# Patient Record
Sex: Male | Born: 2000 | Race: White | Hispanic: No | Marital: Single | Smoking: Never smoker
Health system: Southern US, Community
[De-identification: ages and names within clinical notes are randomized; demographics above are authoritative.]

## PROBLEM LIST (undated history)

## (undated) DIAGNOSIS — C801 Malignant (primary) neoplasm, unspecified: Secondary | ICD-10-CM

## (undated) HISTORY — PX: OTHER SURGICAL HISTORY: SHX169

## (undated) HISTORY — PX: HERNIA REPAIR: SHX51

---
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
## 2018-01-27 DIAGNOSIS — R002 Palpitations: Secondary | ICD-10-CM | POA: Diagnosis not present

## 2018-01-27 DIAGNOSIS — R42 Dizziness and giddiness: Secondary | ICD-10-CM | POA: Diagnosis not present

## 2018-01-27 DIAGNOSIS — R0602 Shortness of breath: Secondary | ICD-10-CM | POA: Diagnosis not present

## 2018-02-01 ENCOUNTER — Ambulatory Visit: Payer: Self-pay | Admitting: *Deleted

## 2018-02-01 ENCOUNTER — Telehealth: Payer: Self-pay

## 2018-02-01 DIAGNOSIS — M67911 Unspecified disorder of synovium and tendon, right shoulder: Secondary | ICD-10-CM | POA: Diagnosis not present

## 2018-02-01 NOTE — Telephone Encounter (Signed)
Please advise 

## 2018-02-01 NOTE — Telephone Encounter (Signed)
ok 

## 2018-02-01 NOTE — Telephone Encounter (Signed)
Pt establishing w/ Dr. Nani Ravens.

## 2018-02-01 NOTE — Telephone Encounter (Signed)
Phone call to pt. Reported he had onset of a panic attack one week ago that lasted several hours, following being "intoxicated on marijuana."  Stated at time of the panic attack, "I felt very anxious, like I was going to die, my heart was racing, and I felt  Dizzy."  Reported the episode was the 1st time he has felt like that.  Reported he has had one other episode, since then, that he felt heart palpitations.  Went to the ER, and was evaluated; was told it was likely related to a panic attack.  Reported since the initial panic attack one week ago, he has had this feeling that he is "disconnected from reality, brain fog, and an overall anxious feeling."   Stated today, he is feeling better.  Has decreased his caffeine consumption in the past week.  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  Requesting an appt. to establish care.  Appt. scheduled for acute care visit on 7/19, and estab. Care appt. On 7/31.  Care advice given per protocol.  Verb. Understanding.  Agrees with plan.        Reason for Disposition . [1] Symptoms of anxiety or panic AND [2] has not been evaluated for this by physician  Answer Assessment - Initial Assessment Questions 1. CONCERN: "What happened that made you call today?"     Had a panic attack about one week ago after he was intoxicated with marijuana 2. ANXIETY SYMPTOM SCREENING: "Can you describe how you have been feeling?"  (e.g., tense, restless, panicky, anxious, keyed up, trouble sleeping, trouble concentrating)     Intermittent feelings of being disconnected from reality, brain fog and anxiety 3. ONSET: "How long have you been feeling this way?"     About one week  4. RECURRENT: "Have you felt this way before?"  If yes: "What happened that time?" "What helped these feelings go away in the past?"      This just started after a panic attack during being high on Marijuana 5. RISK OF HARM - SUICIDAL IDEATION:  "Do you ever have thoughts  of hurting or killing yourself?"  (e.g., yes, no, no but preoccupation with thoughts about death)   - INTENT:  "Do you have thoughts of hurting or killing yourself right NOW?" (e.g., yes, no, N/A)   - PLAN: "Do you have a specific plan for how you would do this?" (e.g., gun, knife, overdose, no plan, N/A)     Denied any intent to harm self 6. RISK OF HARM - HOMICIDAL IDEATION:  "Do you ever have thoughts of hurting or killing someone else?"  (e.g., yes, no, no but preoccupation with thoughts about death)   - INTENT:  "Do you have thoughts of hurting or killing someone right NOW?" (e.g., yes, no, N/A)   - PLAN: "Do you have a specific plan for how you would do this?" (e.g., gun, knife, no plan, N/A)      Denied any intent to hurt others 7. FUNCTIONAL IMPAIRMENT: "How have things been going for you overall in your life? Have you had any more difficulties than usual doing your normal daily activities?"  (e.g., better, same, worse; self-care, school, work, interactions)     Working and functioning 8. SUPPORT: "Who is with you now?" "Who do you live with?" "Do you have family or friends nearby who you can talk to?"      Strong support system through family 65. THERAPIST: "Do you have a  counselor or therapist? Name?"     BOFBPZ  02. STRESSORS: "Has there been any new stress or recent changes in your life?"       Denies  11. CAFFEINE ABUSE: "Do you drink caffeinated beverages, and how much each day?" (e.g., coffee, tea, colas)      Has been a heavy caffeine user in past; cut down a lot over past week 12. SUBSTANCE ABUSE: "Do you use any illegal drugs or alcohol?"       Has smoked Marijuana ; uses it infreq.  13. OTHER SYMPTOMS: "Do you have any other physical symptoms right now?" (e.g., chest pain, palpitations, difficulty breathing, fever)       Had episode of heart palpitations x 2; recent had heart eval. ; denied any of these symptoms at present time  Protocols used: ANXIETY AND PANIC  ATTACK-A-AH

## 2018-02-01 NOTE — Telephone Encounter (Signed)
Copied from [place] [id number]. Topic: General - Other >> Feb 01, 2018  8:39 AM Yvette Rack wrote: Reason for CRM: Dr Larose Kells pt Sean Pena DOB [birth year] would like for Dr Larose Kells to be his sons provider you can reach [name] at [phone]

## 2018-02-03 ENCOUNTER — Ambulatory Visit: Payer: 59 | Admitting: Family Medicine

## 2018-02-07 DIAGNOSIS — M25511 Pain in right shoulder: Secondary | ICD-10-CM | POA: Diagnosis not present

## 2018-02-15 ENCOUNTER — Ambulatory Visit (INDEPENDENT_AMBULATORY_CARE_PROVIDER_SITE_OTHER): Payer: 59 | Admitting: Family Medicine

## 2018-02-15 ENCOUNTER — Encounter: Payer: Self-pay | Admitting: Family Medicine

## 2018-02-15 VITALS — BP 112/72 | HR 73 | Temp 97.5°F | Ht 73.0 in | Wt 172.4 lb

## 2018-02-15 DIAGNOSIS — M67911 Unspecified disorder of synovium and tendon, right shoulder: Secondary | ICD-10-CM | POA: Diagnosis not present

## 2018-02-15 DIAGNOSIS — Z7689 Persons encountering health services in other specified circumstances: Secondary | ICD-10-CM | POA: Diagnosis not present

## 2018-02-15 DIAGNOSIS — F1299 Cannabis use, unspecified with unspecified cannabis-induced disorder: Secondary | ICD-10-CM | POA: Diagnosis not present

## 2018-02-15 DIAGNOSIS — Z23 Encounter for immunization: Secondary | ICD-10-CM | POA: Diagnosis not present

## 2018-02-15 NOTE — Progress Notes (Signed)
Pre visit review using our clinic review tool, if applicable. No additional management support is needed unless otherwise documented below in the visit note. 

## 2018-02-15 NOTE — Patient Instructions (Addendum)
Please consider counseling. Contact [phone] to schedule an appointment or inquire about cost/insurance coverage.  If things do not continue to or stay improved, let me know if you're interested in trying a medication aimed at controlling symptoms.   Good luck this school year.   Let us know if you need anything.

## 2018-02-15 NOTE — Addendum Note (Signed)
Addended by: Sharon Seller B on: 02/15/2018 07:35 AM   Modules accepted: Orders

## 2018-02-15 NOTE — Progress Notes (Signed)
Chief Complaint  Patient presents with  . New Patient (Initial Visit)       New Patient Visit SUBJECTIVE: HPI: Sean Pena is an 17 y.o.male who is being seen for establishing care.  The patient was previously seen at ped's office.  Pt smoked cannabis around 3 weeks ago. Had been having issues with DPP. Got better over past 3 d. Knows it is temporary, curious if any other recs. Does not have personal or famhx of mental health disorders.   Going to college. Has only received Men a,c,w,y. Needs MenB.  No Known Allergies  Past Medical History:  Diagnosis Date  . Cancer [facility])    Chondrosarcoma- 2 surgeries 2016   Past Surgical History:  Procedure Laterality Date  . HERNIA REPAIR    . OTHER SURGICAL HISTORY     Mass removal from femur; L   Family History  Problem Relation Age of Onset  . Heart attack Neg Hx   . Cancer Neg Hx   . Mental illness Neg Hx      No Known Allergies   Takes no meds routinely.  ROS Psych: As noted in HPI   OBJECTIVE: BP 112/72 (BP Location: Left Arm, Patient Position: Sitting, Cuff Size: Normal)   Pulse 73   Temp (!) 97.5 F (36.4 C) (Oral)   Ht 6\' 1"  (1.854 m)   Wt 172 lb 6 oz (78.2 kg)   SpO2 97%   BMI 22.74 kg/m   Constitutional: -  VS reviewed -  Well developed, well nourished, appears stated age -  No apparent distress  Psychiatric: -  Oriented to person, place, and time -  Memory intact -  Affect and mood normal -  Fluent conversation, good eye contact -  Judgment and insight age appropriate  Eye: -  Conjunctivae clear, no discharge -  Pupils symmetric, round, reactive to light  ENMT: -  MMM    Pharynx moist, no exudate, no erythema  Cardiovascular: -  RRR -  No LE edema  Respiratory: -  Normal respiratory effort, no accessory muscle use, no retraction -  Breath sounds equal, no wheezes, no ronchi, no crackles  Neurological:  -  DTR's equal and symmetric  Skin: -  No significant lesion on inspection -  Warm and dry  to palpation   ASSESSMENT/PLAN: Establishing care with new doctor, encounter for  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 Tread lightly w cannabis or other psychoactive substances in future while at school. [facility] info given. Send message if not improving, can start medication if interested.   Patient should return in 1 yr for CPE when he is 27. The patient voiced understanding and agreement to the plan.   [place], DO 02/15/18  7:28 AM

## 2018-02-17 ENCOUNTER — Encounter: Payer: Self-pay | Admitting: Family Medicine

## 2018-02-21 DIAGNOSIS — M7541 Impingement syndrome of right shoulder: Secondary | ICD-10-CM | POA: Diagnosis not present

## 2018-02-28 DIAGNOSIS — Q233 Congenital mitral insufficiency: Secondary | ICD-10-CM | POA: Diagnosis not present

## 2018-02-28 DIAGNOSIS — Q231 Congenital insufficiency of aortic valve: Secondary | ICD-10-CM | POA: Diagnosis not present

## 2018-03-03 DIAGNOSIS — M7541 Impingement syndrome of right shoulder: Secondary | ICD-10-CM | POA: Diagnosis not present

## 2018-03-23 DIAGNOSIS — Z7289 Other problems related to lifestyle: Secondary | ICD-10-CM | POA: Diagnosis not present

## 2018-03-23 DIAGNOSIS — M7541 Impingement syndrome of right shoulder: Secondary | ICD-10-CM | POA: Diagnosis not present

## 2018-03-23 DIAGNOSIS — R1011 Right upper quadrant pain: Secondary | ICD-10-CM | POA: Diagnosis not present

## 2018-03-23 DIAGNOSIS — K2970 Gastritis, unspecified, without bleeding: Secondary | ICD-10-CM | POA: Diagnosis not present

## 2018-04-21 DIAGNOSIS — H66004 Acute suppurative otitis media without spontaneous rupture of ear drum, recurrent, right ear: Secondary | ICD-10-CM | POA: Diagnosis not present

## 2018-05-02 ENCOUNTER — Encounter: Payer: Self-pay | Admitting: Family Medicine

## 2018-05-04 ENCOUNTER — Ambulatory Visit (INDEPENDENT_AMBULATORY_CARE_PROVIDER_SITE_OTHER): Payer: 59 | Admitting: Family Medicine

## 2018-05-04 ENCOUNTER — Encounter: Payer: Self-pay | Admitting: Family Medicine

## 2018-05-04 VITALS — BP 102/68 | HR 93 | Temp 98.6°F | Ht 72.0 in | Wt 179.5 lb

## 2018-05-04 DIAGNOSIS — Z23 Encounter for immunization: Secondary | ICD-10-CM

## 2018-05-04 DIAGNOSIS — R748 Abnormal levels of other serum enzymes: Secondary | ICD-10-CM | POA: Diagnosis not present

## 2018-05-04 LAB — IBC PANEL
IRON: 163 ug/dL (ref 42–165)
Saturation Ratios: 42.8 % (ref 20.0–50.0)
TRANSFERRIN: 272 mg/dL (ref 212.0–360.0)

## 2018-05-04 LAB — HEPATITIS B SURFACE ANTIGEN: Hepatitis B Surface Ag: NONREACTIVE

## 2018-05-04 LAB — HEPATIC FUNCTION PANEL
ALT: 17 U/L (ref 0–53)
AST: 21 U/L (ref 0–37)
Albumin: 4.9 g/dL (ref 3.5–5.2)
Alkaline Phosphatase: 63 U/L (ref 52–171)
BILIRUBIN DIRECT: 0.2 mg/dL (ref 0.0–0.3)
BILIRUBIN TOTAL: 1 mg/dL (ref 0.2–1.2)
TOTAL PROTEIN: 7.4 g/dL (ref 6.0–8.3)

## 2018-05-04 LAB — HEPATITIS C ANTIBODY
Hepatitis C Ab: NONREACTIVE
SIGNAL TO CUT-OFF: 0.02 (ref <1.00)

## 2018-05-04 LAB — FERRITIN: Ferritin: 77.2 ng/mL (ref 22.0–322.0)

## 2018-05-04 NOTE — Patient Instructions (Signed)
We will be in contact regarding your lab results.   If still elevated, I would like to recheck an ultrasound of your liver. Depending on those results, I may ask you abstain from alcohol until we recheck.  Drink responsibly.   Let us know if you need anything.

## 2018-05-04 NOTE — Progress Notes (Signed)
Pre visit review using our clinic review tool, if applicable. No additional management support is needed unless otherwise documented below in the visit note. 

## 2018-05-04 NOTE — Progress Notes (Signed)
Chief Complaint  Patient presents with  . Results    Subjective: Patient is a 17 y.o. male here for liver concern.  Drinks 2-3 times per week. 8-9 drinks during binges, 3-4 drinks otherwise. Started 2 years ago.  He was in the hospital in February of this year and had elevated liver enzymes.  An ultrasound showed findings consistent with fatty liver.  No gallstones or gallbladder inflammation.  On 2 occasions since then, he has had RUQ abd pain described as burning after drinking copious amounts of alcohol, usually also when liquor is involved. This has not happened recently.  He denies any family history of liver pathologies.  He is not having any yellowing of the skin, bruising, prominent vessels of the abdomen, clay colored stools, or bloating.  ROS: Skin: no jaundice GI: As noted in HPI  Past Medical History:  Diagnosis Date  . Cancer [facility])    Chondrosarcoma of L humerus- 2 surgeries 2016    Objective: BP 102/68 (BP Location: Left Arm, Patient Position: Sitting, Cuff Size: Normal)   Pulse 93   Temp 98.6 F (37 C) (Oral)   Ht 6' (1.829 m)   Wt 179 lb 8 oz (81.4 kg)   SpO2 98%   BMI 24.34 kg/m  General: Awake, appears stated age HEENT: MMM, EOMi, no scleral icterus Abd: Soft, NT, ND, no masses or organomegaly, neg Murphy's Skin: No jaundice, no caput medusa or telangiectasias Heart: RRR, no murmurs Lungs: CTAB, no rales, wheezes or rhonchi. No accessory muscle use Psych: Age appropriate judgment and insight, normal affect and mood  Assessment and Plan: Elevated liver enzymes - Plan: Ferritin, Hepatitis C antibody, Hepatitis B surface antigen, IBC panel, Hepatic function panel  Flu vaccine need - Plan: Flu Vaccine QUAD 6+ mos PF IM (Fluarix Quad PF)  Orders as above. If still elevated, will ck [place] again. May need to stop alcohol consumption depending on his results.  Fu pending above. The patient voiced understanding and agreement to the plan.  [place],  DO 05/04/18  12:04 PM

## 2018-05-09 DIAGNOSIS — H9209 Otalgia, unspecified ear: Secondary | ICD-10-CM | POA: Diagnosis not present

## 2018-05-11 DIAGNOSIS — H6120 Impacted cerumen, unspecified ear: Secondary | ICD-10-CM | POA: Diagnosis not present

## 2018-05-25 DIAGNOSIS — H1089 Other conjunctivitis: Secondary | ICD-10-CM | POA: Diagnosis not present

## 2018-05-31 DIAGNOSIS — C499 Malignant neoplasm of connective and soft tissue, unspecified: Secondary | ICD-10-CM | POA: Diagnosis not present

## 2018-05-31 DIAGNOSIS — C419 Malignant neoplasm of bone and articular cartilage, unspecified: Secondary | ICD-10-CM | POA: Diagnosis not present

## 2018-06-07 DIAGNOSIS — Q231 Congenital insufficiency of aortic valve: Secondary | ICD-10-CM | POA: Diagnosis not present

## 2018-06-07 DIAGNOSIS — R002 Palpitations: Secondary | ICD-10-CM | POA: Diagnosis not present

## 2018-06-07 DIAGNOSIS — Q233 Congenital mitral insufficiency: Secondary | ICD-10-CM | POA: Diagnosis not present

## 2018-06-21 DIAGNOSIS — R002 Palpitations: Secondary | ICD-10-CM | POA: Diagnosis not present

## 2018-08-21 DIAGNOSIS — R29898 Other symptoms and signs involving the musculoskeletal system: Secondary | ICD-10-CM | POA: Diagnosis not present

## 2018-09-04 DIAGNOSIS — R29898 Other symptoms and signs involving the musculoskeletal system: Secondary | ICD-10-CM | POA: Diagnosis not present

## 2018-09-11 DIAGNOSIS — R1011 Right upper quadrant pain: Secondary | ICD-10-CM | POA: Diagnosis not present

## 2018-09-18 DIAGNOSIS — R29898 Other symptoms and signs involving the musculoskeletal system: Secondary | ICD-10-CM | POA: Diagnosis not present

## 2019-03-05 ENCOUNTER — Telehealth: Payer: Self-pay | Admitting: *Deleted

## 2019-03-05 NOTE — Telephone Encounter (Signed)
Called to offer Virtual Visit but the patient stated he did not feel he needed one for now.

## 2019-03-05 NOTE — Telephone Encounter (Signed)
Unknown if he should be concerned. Cont to social distance, wear masks and look out for symptoms. Ty.

## 2019-03-05 NOTE — Telephone Encounter (Signed)
Who Is Calling Patient / Member / Family / Caregiver Call Type Triage / Clinical Relationship To Patient Self Return Phone Number [phone] (Primary) Chief Complaint Health information question (non symptomatic) Reason for Call Symptomatic / Request for Health Information Initial Comment Caller states that he has cv19 and he has questions about the disease. Translation No Nurse Assessment Nurse: Markus Daft, RN, [place] Date/Time (Eastern Time): 03/03/2019 11:54:21 AM Confirm and document reason for call. If symptomatic, describe symptoms. ---Caller states that he has COVID-19, tested positive on July 20. c/o fatigue for a few days. And then felt good for 2-3 weeks. He then had fatigue again on Tuesday or Wednesday and for the last few days. He got tested again yesterday, and was positive again. He stayed in quarantine for 10 days, til 30th, and then he had felt better, and so he returned to life as usual. He had a new exposure this week to people who tested positive. Should he be concerned?

## 2019-03-05 NOTE — Telephone Encounter (Signed)
Patient aware.

## 2019-03-07 ENCOUNTER — Encounter: Payer: Self-pay | Admitting: Family Medicine

## 2019-03-22 ENCOUNTER — Encounter: Payer: Self-pay | Admitting: Family Medicine

## 2019-06-02 IMAGING — DX DG CHEST 2V
2 series · 2 of 2 positions shown · non-contrast
Comparison: 08/21/2006

CLINICAL DATA: Cough

EXAM:
CHEST  2 VIEW

[chest pa]
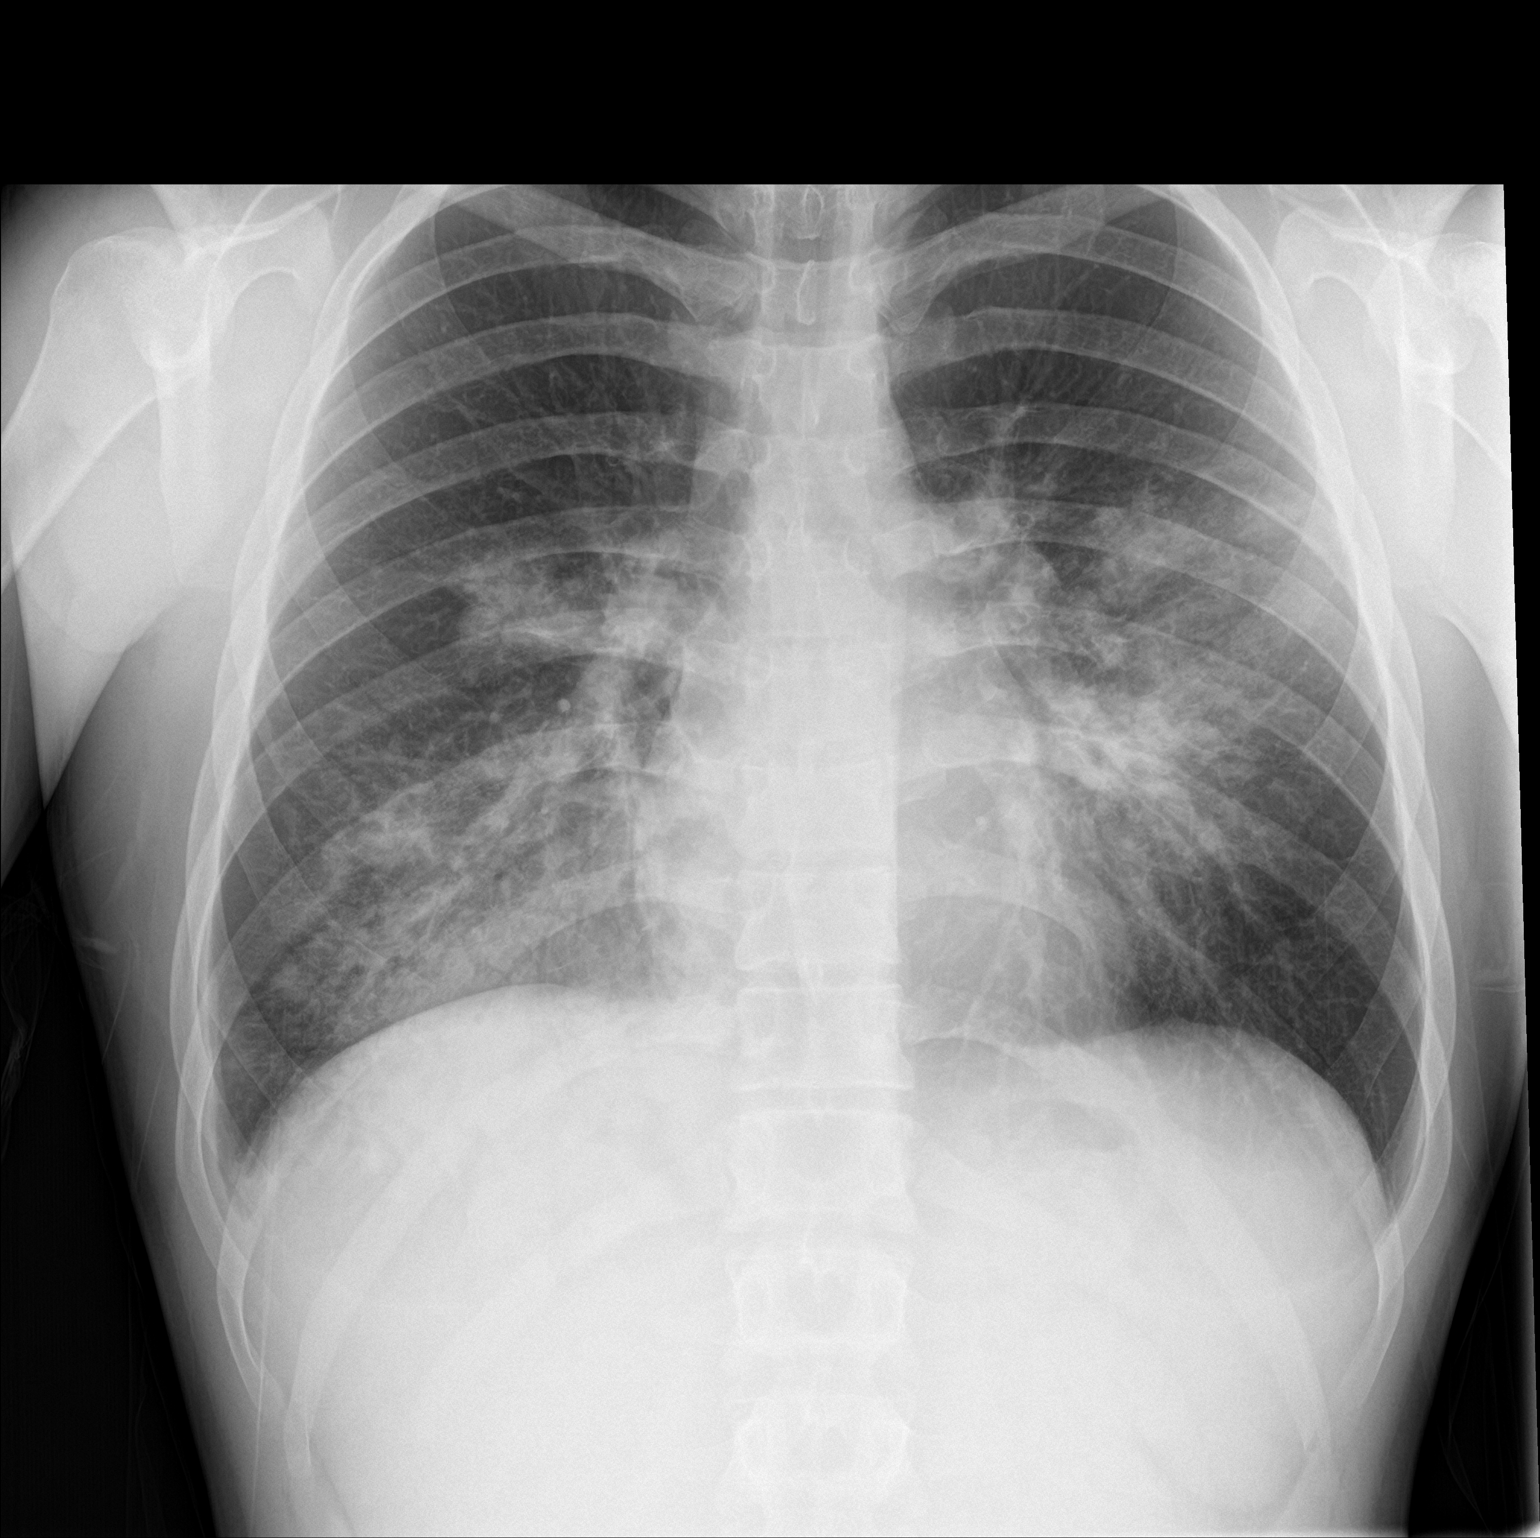

[chest lat]
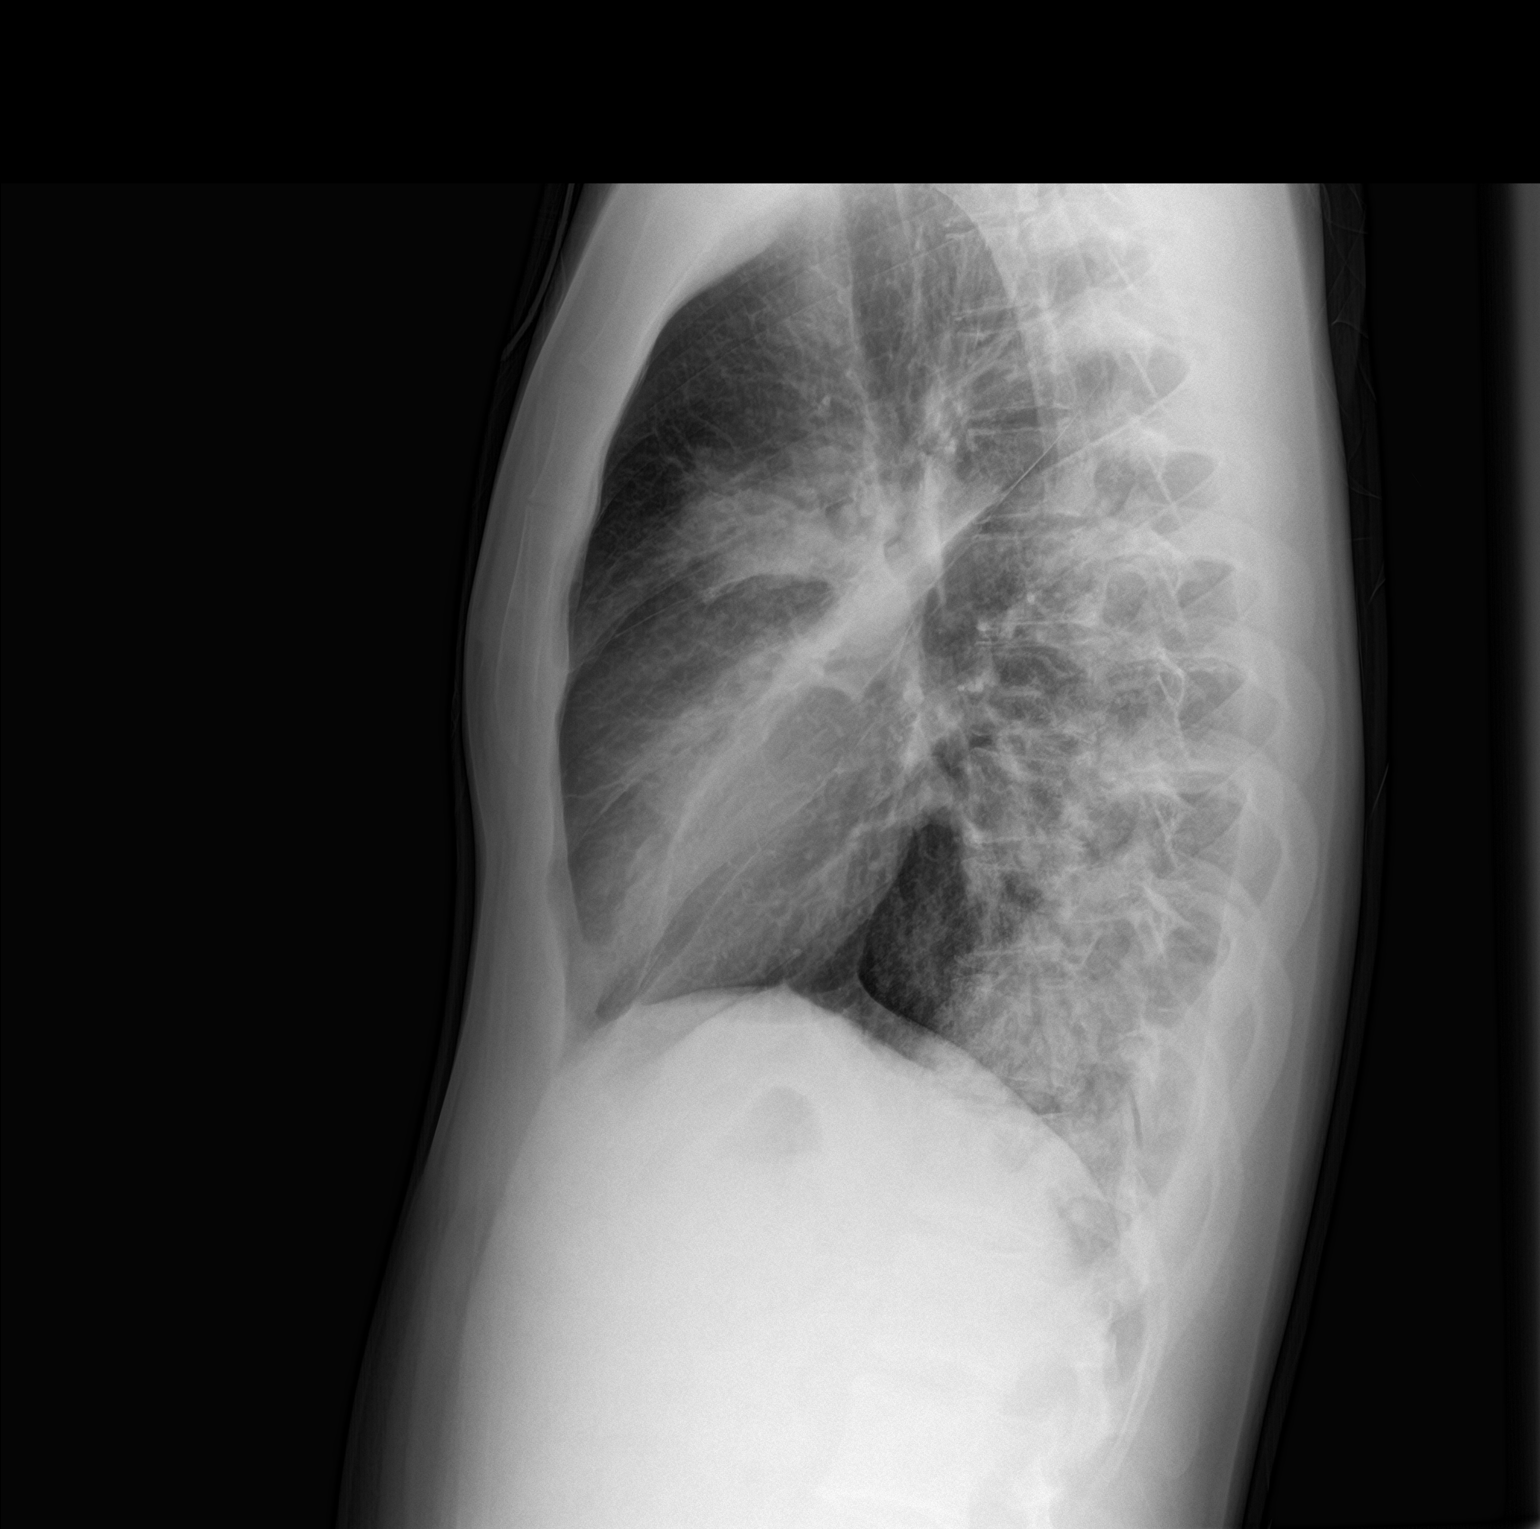

[2 of 2 positions shown; findings below may reference images not displayed]

FINDINGS: Areas of pneumonia bilaterally in the perihilar regions and in the
right lower lobe. No effusions. Heart is normal size. No acute bony
abnormality.
IMPRESSION: Bilateral upper lobe and right lower lobe pneumonia.

## 2019-06-04 IMAGING — DX DG CHEST 2V
2 series · 2 of 2 positions shown · non-contrast
Comparison: Chest x-ray of August 27, 2017

CLINICAL DATA: Dyspnea, known pneumonia.

EXAM:
CHEST  2 VIEW

[chest pa]
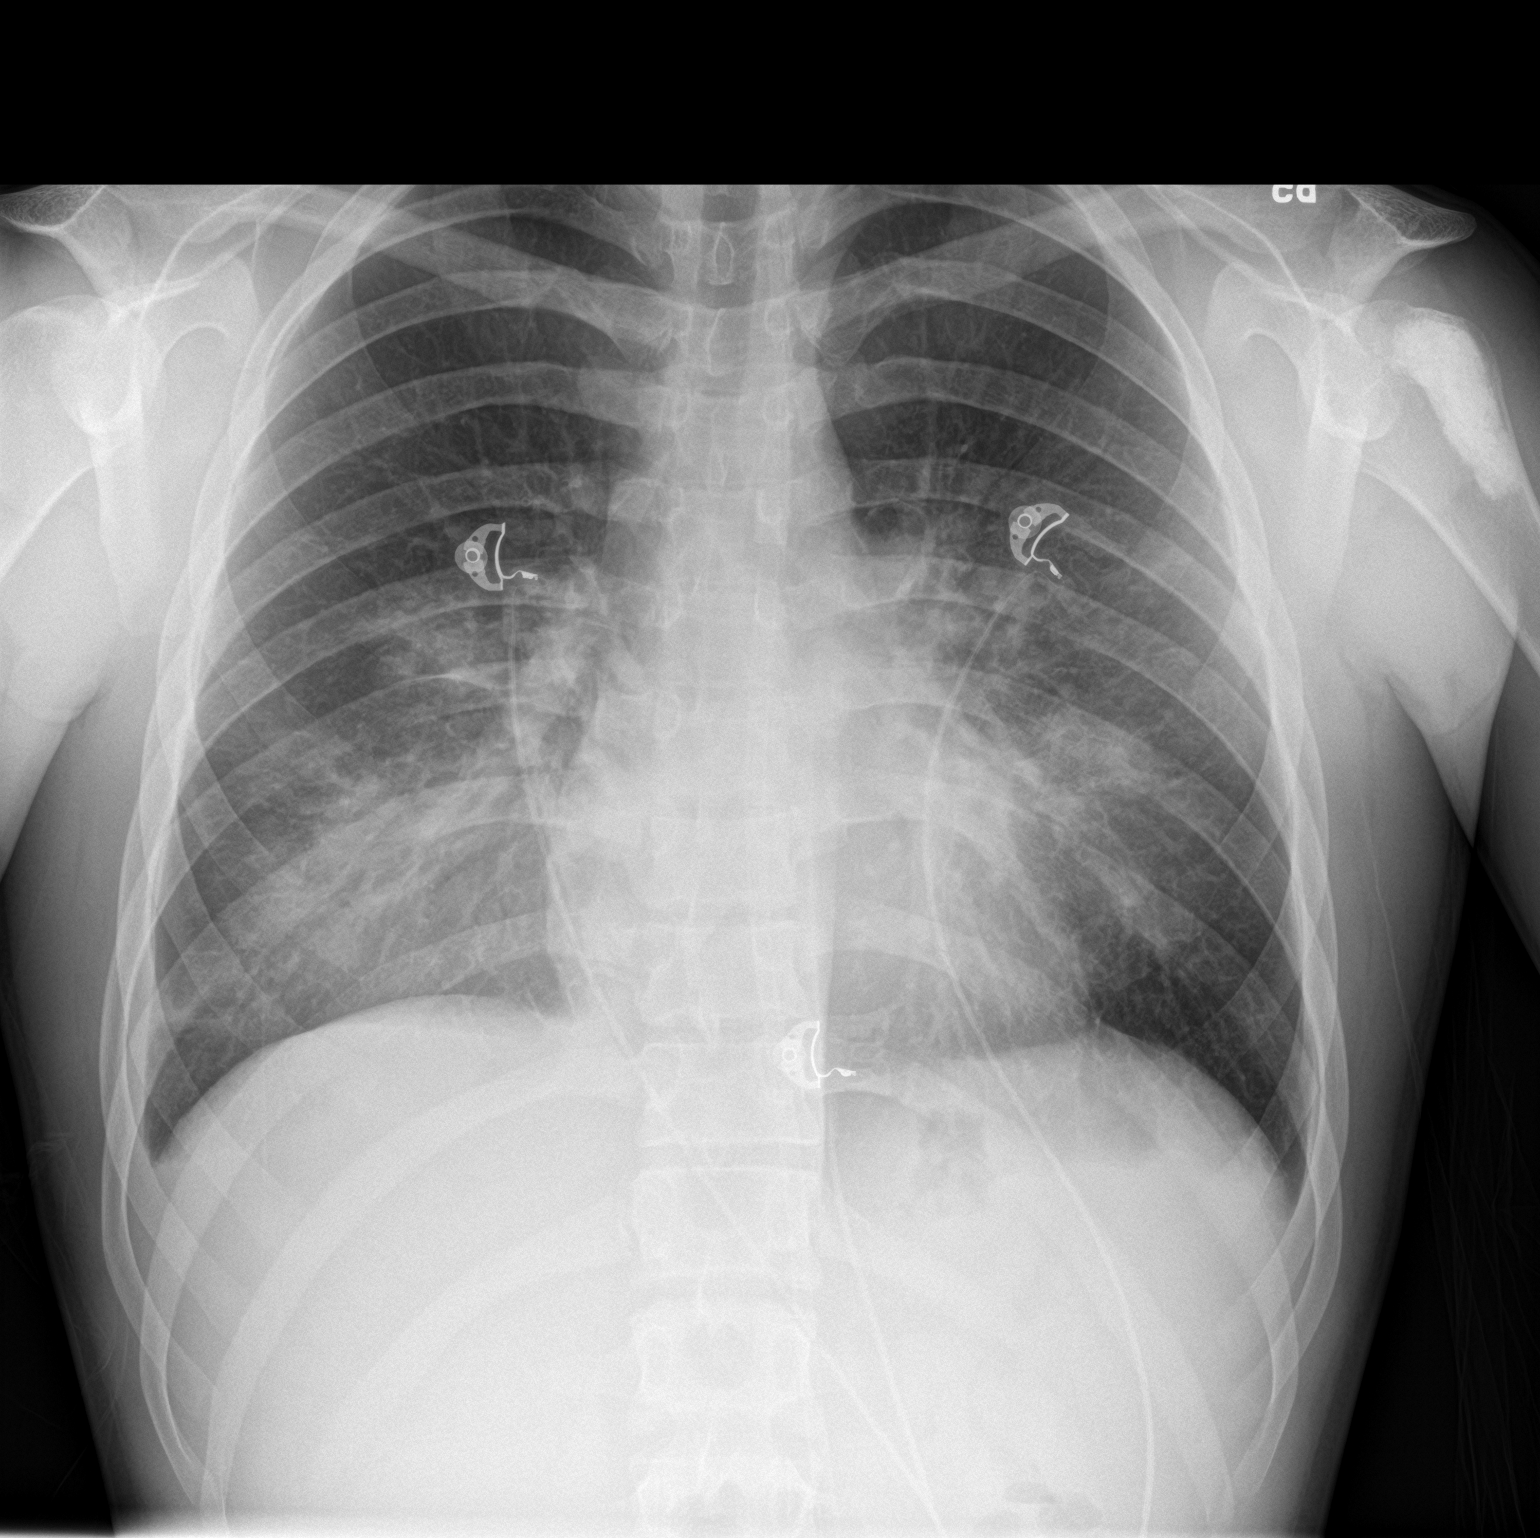

[chest lat]
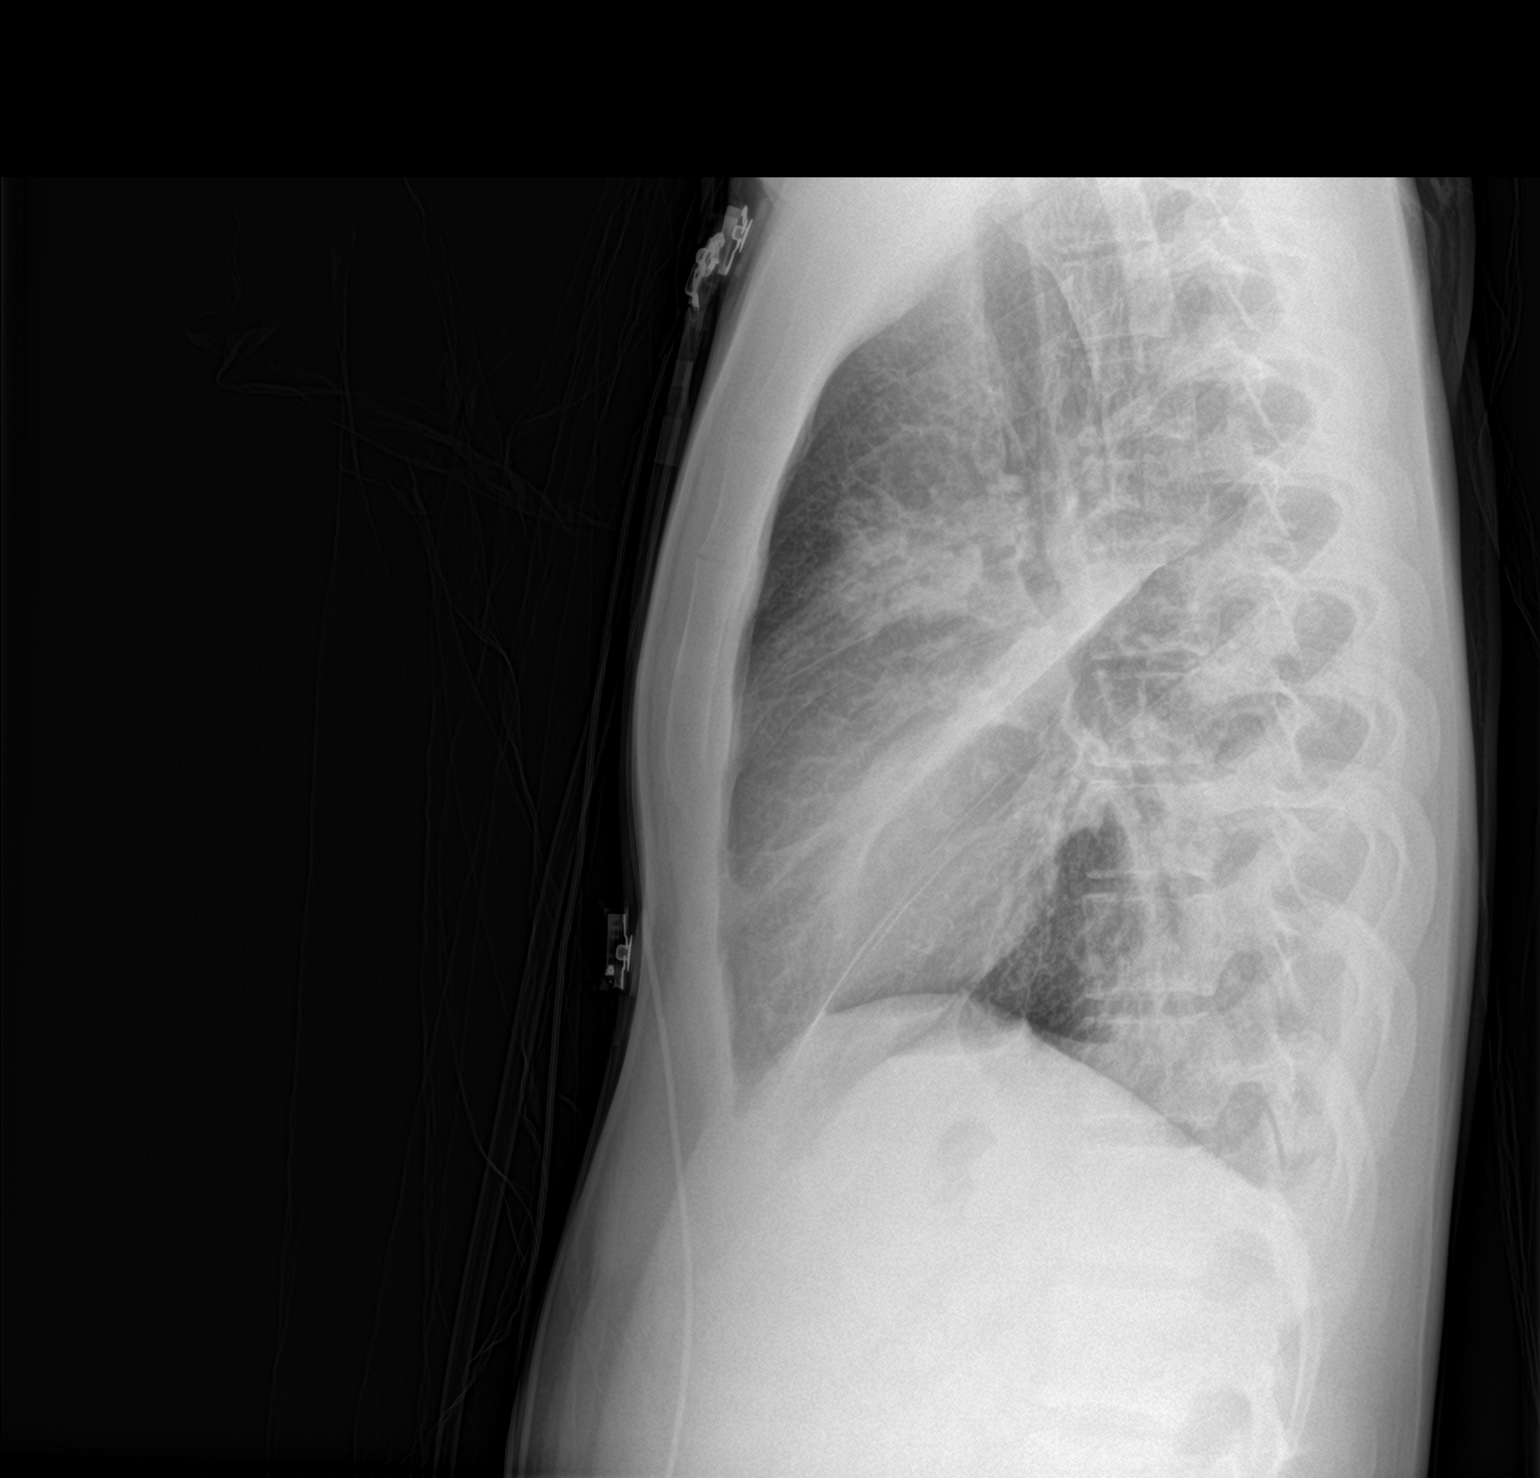

[2 of 2 positions shown; findings below may reference images not displayed]

FINDINGS: Patchy bilateral perihilar interstitial densities persist. There are
densities also in the right lower lobe. When compared to the
previous study there has not been significant interval change. There
is a small right pleural effusion which is stable. The heart and
pulmonary vascularity are normal. The bony thorax is unremarkable.
Sclerosis in the proximal left humeral head and neck is unchanged
from 2 days ago.
IMPRESSION: Bilateral pneumonia, stable.  Small right pleural effusion.

## 2020-01-18 IMAGING — US US ABDOMEN LIMITED
1 series · 14 of 25 positions shown · non-contrast
Comparison: None.

CLINICAL DATA: Elevated LFTs

EXAM:
ULTRASOUND ABDOMEN LIMITED RIGHT UPPER QUADRANT

[Series 1: us abdomen limited · 0.19mm/px · 14 of 70 slices shown]
[im 1/70]
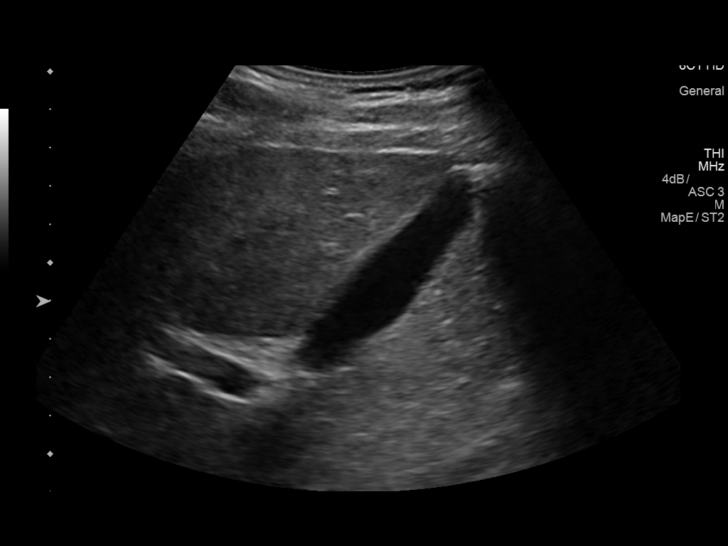
[im 6/70]
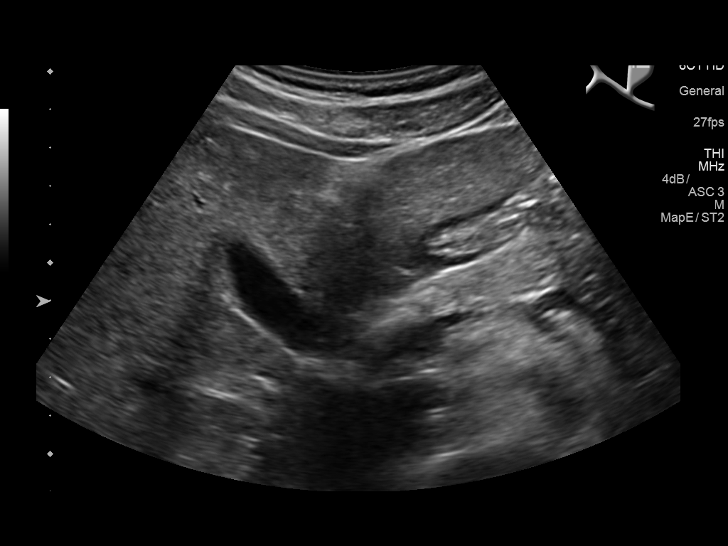
[im 12/70]
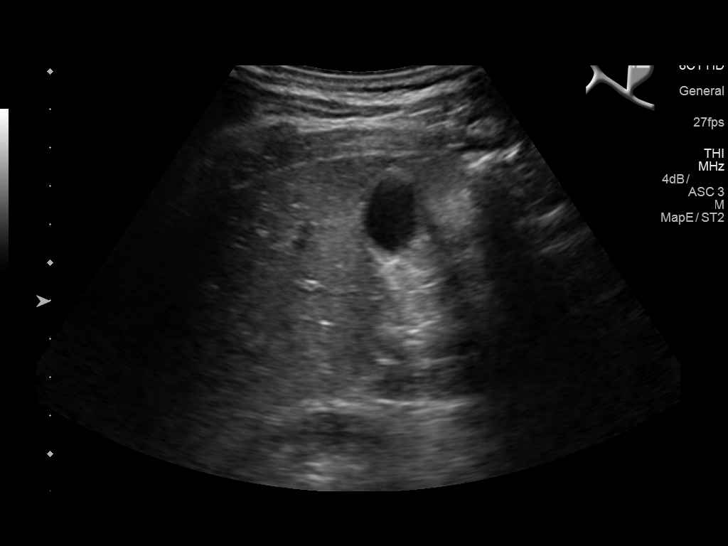
[im 18/70]
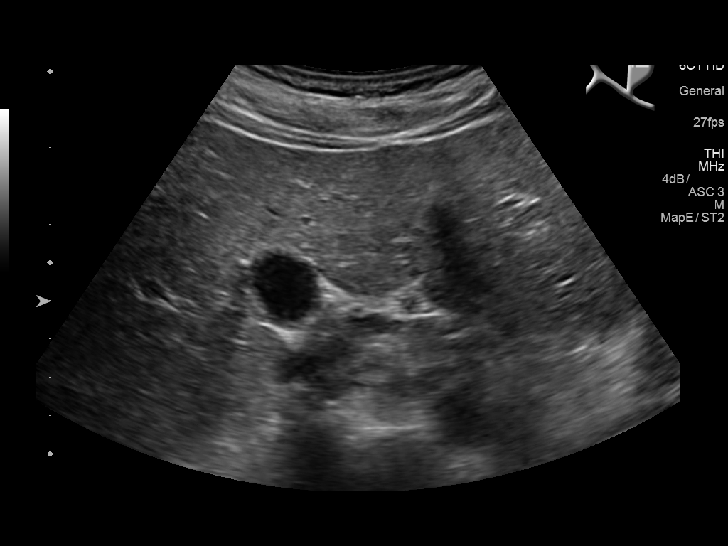
[im 24/70]
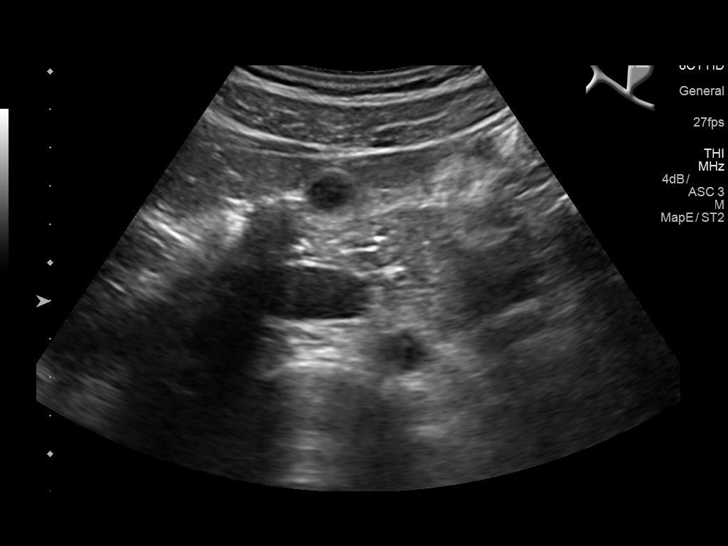
[im 26/70]
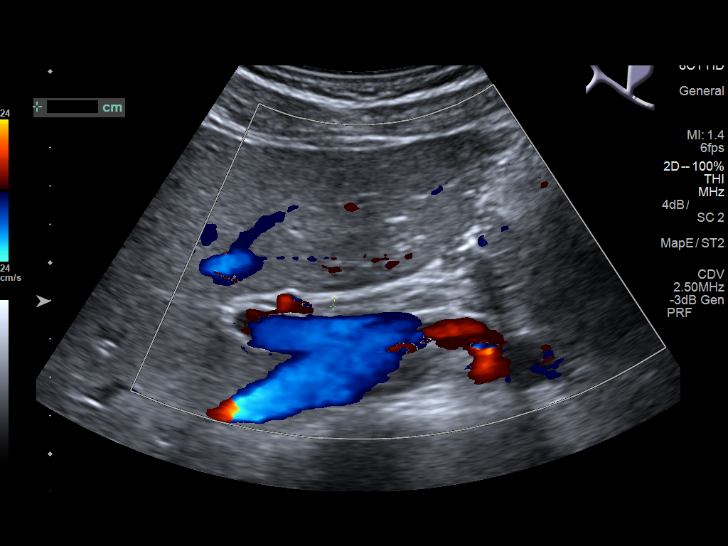
[im 32/70]
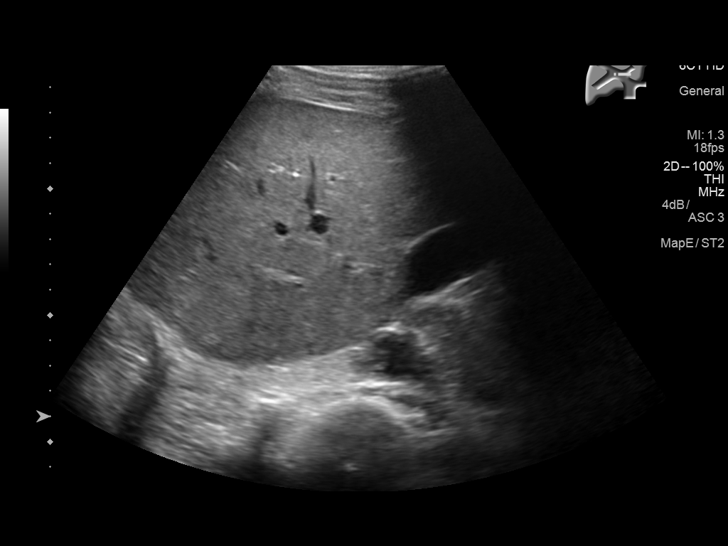
[im 38/70]
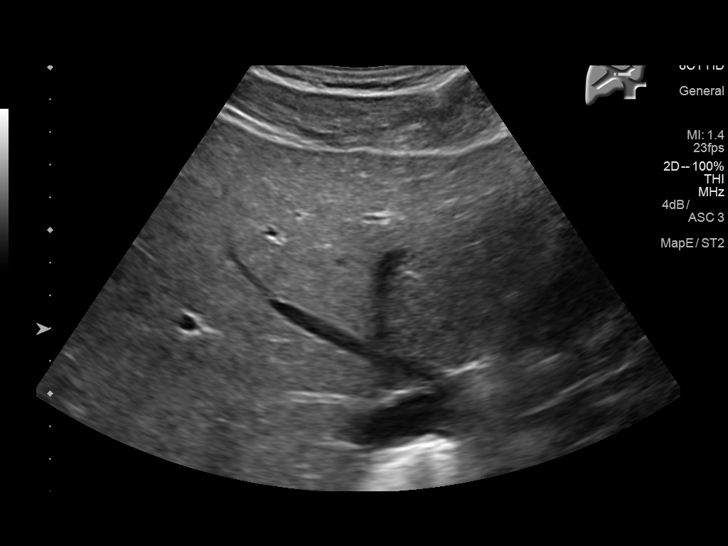
[im 44/70]
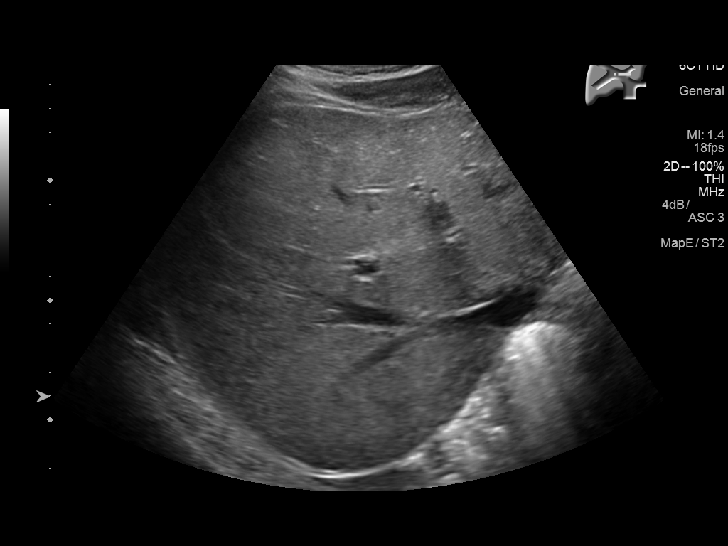
[im 47/70]
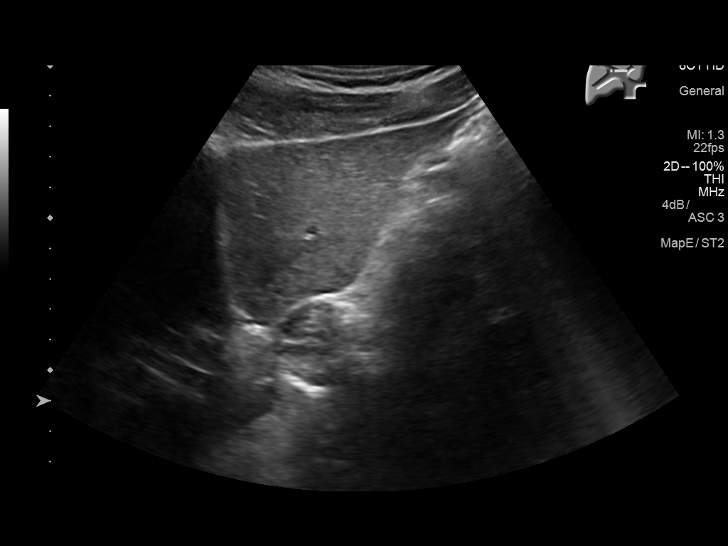
[im 52/70]
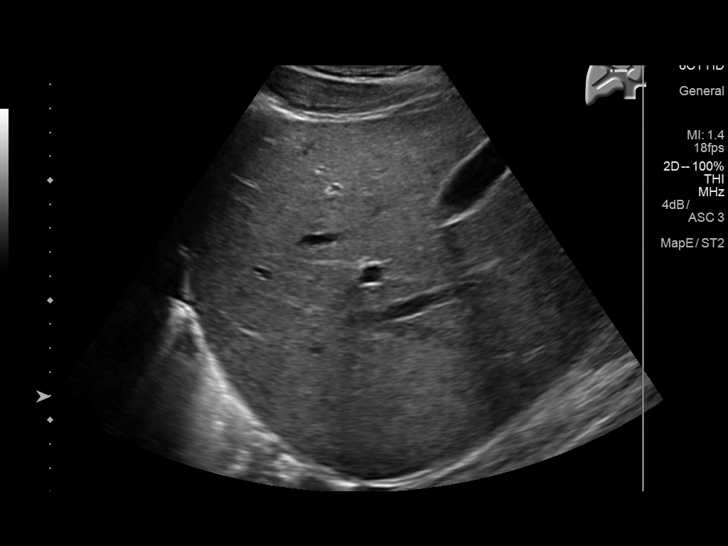
[im 58/70]
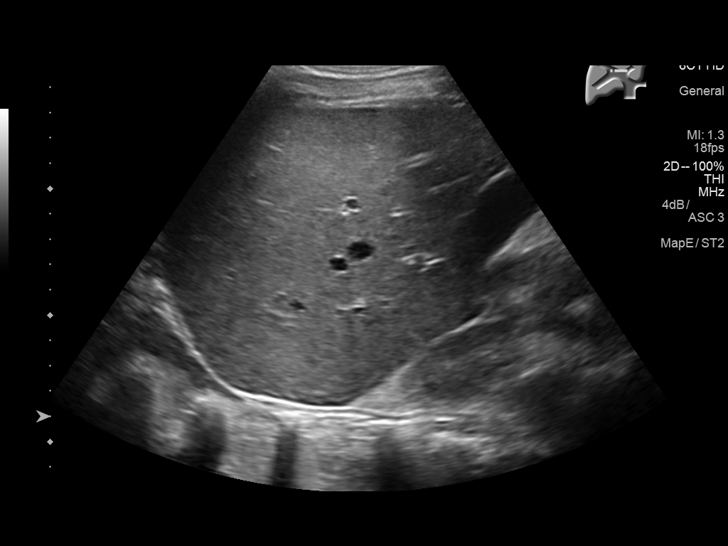
[im 64/70]
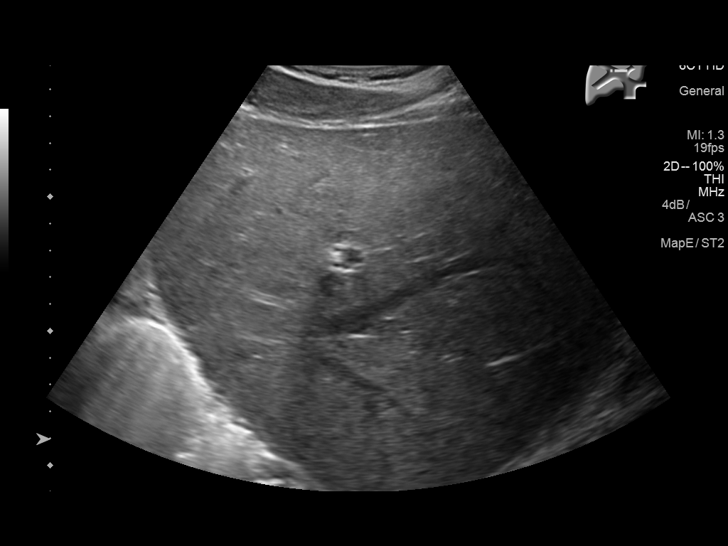
[im 70/70]
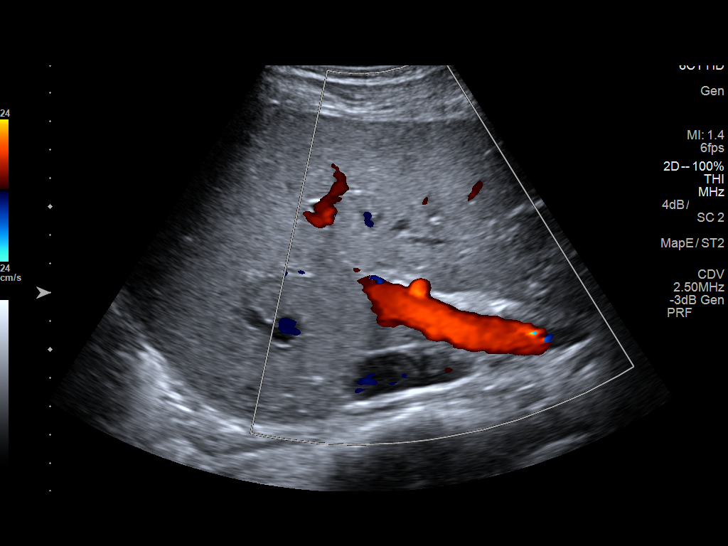

[14 of 25 positions shown; findings below may reference images not displayed]

FINDINGS: Gallbladder:

No gallstones or wall thickening visualized. No sonographic Murphy
sign noted by sonographer.

Common bile duct:

Diameter: 2.3 mm

Liver:

No focal lesion identified. Increased hepatic parenchymal
echogenicity. Portal vein is patent on color Doppler imaging with
normal direction of blood flow towards the liver.
IMPRESSION: 1. No cholelithiasis or sonographic evidence of acute cholecystitis.
2. Increased hepatic echogenicity as can be seen with hepatic
steatosis.

## 2020-02-01 DIAGNOSIS — C419 Malignant neoplasm of bone and articular cartilage, unspecified: Secondary | ICD-10-CM | POA: Diagnosis not present

## 2020-02-01 DIAGNOSIS — C499 Malignant neoplasm of connective and soft tissue, unspecified: Secondary | ICD-10-CM | POA: Diagnosis not present

## 2020-04-21 DIAGNOSIS — Q231 Congenital insufficiency of aortic valve: Secondary | ICD-10-CM | POA: Diagnosis not present

## 2020-04-21 DIAGNOSIS — Q233 Congenital mitral insufficiency: Secondary | ICD-10-CM | POA: Diagnosis not present

## 2020-06-04 DIAGNOSIS — R509 Fever, unspecified: Secondary | ICD-10-CM | POA: Diagnosis not present

## 2020-07-02 DIAGNOSIS — R6889 Other general symptoms and signs: Secondary | ICD-10-CM | POA: Diagnosis not present

## 2020-07-07 DIAGNOSIS — Z20822 Contact with and (suspected) exposure to covid-19: Secondary | ICD-10-CM | POA: Diagnosis not present

## 2020-08-01 DIAGNOSIS — C499 Malignant neoplasm of connective and soft tissue, unspecified: Secondary | ICD-10-CM | POA: Diagnosis not present

## 2020-08-01 DIAGNOSIS — C419 Malignant neoplasm of bone and articular cartilage, unspecified: Secondary | ICD-10-CM | POA: Diagnosis not present

## 2020-09-26 DIAGNOSIS — Z20822 Contact with and (suspected) exposure to covid-19: Secondary | ICD-10-CM | POA: Diagnosis not present

## 2020-09-27 DIAGNOSIS — Z03818 Encounter for observation for suspected exposure to other biological agents ruled out: Secondary | ICD-10-CM | POA: Diagnosis not present

## 2020-09-27 DIAGNOSIS — Z20822 Contact with and (suspected) exposure to covid-19: Secondary | ICD-10-CM | POA: Diagnosis not present

## 2020-11-24 DIAGNOSIS — H6123 Impacted cerumen, bilateral: Secondary | ICD-10-CM | POA: Diagnosis not present

## 2020-11-24 DIAGNOSIS — R0981 Nasal congestion: Secondary | ICD-10-CM | POA: Diagnosis not present

## 2020-11-24 DIAGNOSIS — R051 Acute cough: Secondary | ICD-10-CM | POA: Diagnosis not present

## 2021-07-03 DIAGNOSIS — C419 Malignant neoplasm of bone and articular cartilage, unspecified: Secondary | ICD-10-CM | POA: Diagnosis not present

## 2021-07-03 DIAGNOSIS — C499 Malignant neoplasm of connective and soft tissue, unspecified: Secondary | ICD-10-CM | POA: Diagnosis not present

## 2021-08-06 DIAGNOSIS — Z131 Encounter for screening for diabetes mellitus: Secondary | ICD-10-CM | POA: Diagnosis not present

## 2021-08-11 ENCOUNTER — Encounter: Payer: Self-pay | Admitting: Family Medicine

## 2021-08-11 ENCOUNTER — Other Ambulatory Visit: Payer: Self-pay | Admitting: Family Medicine

## 2021-08-11 MED ORDER — TYPHOID VI POLYSACCHARIDE VACC 25 MCG/0.5ML IM SOLN: 0.5000 mL | Freq: Once | INTRAMUSCULAR | 0 refills | Status: AC

## 2021-09-22 ENCOUNTER — Ambulatory Visit (INDEPENDENT_AMBULATORY_CARE_PROVIDER_SITE_OTHER): Payer: BC Managed Care – PPO | Admitting: Family Medicine

## 2021-09-22 ENCOUNTER — Encounter: Payer: Self-pay | Admitting: Family Medicine

## 2021-09-22 VITALS — BP 110/72 | HR 80 | Temp 97.9°F | Ht 72.0 in | Wt 180.2 lb

## 2021-09-22 DIAGNOSIS — Z0000 Encounter for general adult medical examination without abnormal findings: Secondary | ICD-10-CM | POA: Diagnosis not present

## 2021-09-22 DIAGNOSIS — Z23 Encounter for immunization: Secondary | ICD-10-CM

## 2021-09-22 LAB — LIPID PANEL
Cholesterol: 162 mg/dL (ref 0–200)
HDL: 57.4 mg/dL (ref >39.00)
LDL Cholesterol: 85 mg/dL (ref 0–99)
NonHDL: 104.5
Total CHOL/HDL Ratio: 3
Triglycerides: 98 mg/dL (ref 0.0–149.0)
VLDL: 19.6 mg/dL (ref 0.0–40.0)

## 2021-09-22 LAB — CBC
HCT: 41.6 % (ref 39.0–52.0)
Hemoglobin: 14.3 g/dL (ref 13.0–17.0)
MCHC: 34.5 g/dL (ref 30.0–36.0)
MCV: 89.8 fl (ref 78.0–100.0)
Platelets: 184 10*3/uL (ref 150.0–400.0)
RBC: 4.63 Mil/uL (ref 4.22–5.81)
RDW: 12 % (ref 11.5–15.5)
WBC: 5.6 10*3/uL (ref 4.0–10.5)

## 2021-09-22 LAB — COMPREHENSIVE METABOLIC PANEL
ALT: 26 U/L (ref 0–53)
AST: 21 U/L (ref 0–37)
Albumin: 4.7 g/dL (ref 3.5–5.2)
Alkaline Phosphatase: 57 U/L (ref 39–117)
BUN: 19 mg/dL (ref 6–23)
CO2: 30 mEq/L (ref 19–32)
Calcium: 9.4 mg/dL (ref 8.4–10.5)
Chloride: 103 mEq/L (ref 96–112)
Creatinine, Ser: 1.03 mg/dL (ref 0.40–1.50)
GFR: 102.92 mL/min (ref >60.00)
Glucose, Bld: 60 mg/dL — ABNORMAL LOW (ref 70–99)
Potassium: 4.6 mEq/L (ref 3.5–5.1)
Sodium: 139 mEq/L (ref 135–145)
Total Bilirubin: 1 mg/dL (ref 0.2–1.2)
Total Protein: 7.1 g/dL (ref 6.0–8.3)

## 2021-09-22 NOTE — Progress Notes (Signed)
Chief Complaint  ?Patient presents with  ? Annual Exam  ? ? ?Well Male ?Sean Pena is here for a complete physical.   ?His last physical was >1 year ago.  ?Current diet: in general, a "healthy" diet.   ?Current exercise: cardio, lifting weights ?Weight trend: stable ?Fatigue out of ordinary? No. ?Seat belt? Yes.   ?Advanced directive? No ? ?Health maintenance ?Tetanus- Due ?HIV- Yes ?Hep C- Yes ? ?Past Medical History:  ?Diagnosis Date  ? Cancer [facility])   ? Chondrosarcoma of L humerus- 2 surgeries 2016  ?  ? ?Past Surgical History:  ?Procedure Laterality Date  ? HERNIA REPAIR    ? OTHER SURGICAL HISTORY    ? Mass removal from humerus; L  ? ? ?Medications  ?Takes no meds routinely.  ? ?Allergies ?No Known Allergies ? ?Family History ?Family History  ?Problem Relation Age of Onset  ? High Cholesterol Father   ? Cancer Maternal Grandmother   ? Cancer Maternal Grandfather   ? High Cholesterol Paternal Grandfather   ? Heart attack Neg Hx   ? Mental illness Neg Hx   ? ? ?Review of Systems: ?Constitutional: no fevers or chills ?Eye:  no recent significant change in vision ?Ear/Nose/Mouth/Throat:  Ears:  no hearing loss ?Nose/Mouth/Throat:  no complaints of nasal congestion, no sore throat ?Cardiovascular:  no chest pain ?Respiratory:  no shortness of breath ?Gastrointestinal:  no abdominal pain, no change in bowel habits ?GU:  Male: negative for dysuria ?Musculoskeletal/Extremities:  no pain of the joints ?Integumentary (Skin/Breast):  no abnormal skin lesions reported ?Neurologic:  no headaches ?Endocrine: No unexpected weight changes ?Hematologic/Lymphatic:  no night sweats ? ?Exam ?BP 110/72   Pulse 80   Temp 97.9 ?F (36.6 ?C) (Oral)   Ht 6' (1.829 m)   Wt 180 lb 4 oz (81.8 kg)   SpO2 99%   BMI 24.45 kg/m?  ?General:  well developed, well nourished, in no apparent distress ?Skin:  no significant moles, warts, or growths ?Head:  no masses, lesions, or tenderness ?Eyes:  pupils equal and round, sclera anicteric  without injection ?Ears:  canals without lesions, TMs shiny without retraction, no obvious effusion, no erythema ?Nose:  nares patent, septum midline, mucosa normal ?Throat/Pharynx:  lips and gingiva without lesion; tongue and uvula midline; non-inflamed pharynx; no exudates or postnasal drainage ?Neck: neck supple without adenopathy, thyromegaly, or masses ?Lungs:  clear to auscultation, breath sounds equal bilaterally, no respiratory distress ?Cardio:  regular rate and rhythm, no bruits, no LE edema ?Abdomen:  abdomen soft, nontender; bowel sounds normal; no masses or organomegaly ?Genital (male): Deferred ?Rectal: Deferred ?Musculoskeletal:  symmetrical muscle groups noted without atrophy or deformity ?Extremities:  no clubbing, cyanosis, or edema, no deformities, no skin discoloration ?Neuro:  gait normal; deep tendon reflexes normal and symmetric ?Psych: well oriented with normal range of affect and appropriate judgment/insight ? ?Assessment and Plan ? ?Well adult exam - Plan: CBC, Comprehensive metabolic panel, Lipid panel  ? ?Well 21 y.o. male. ?Counseled on diet and exercise. ?Self testicular exams recommended at least monthly.  ?Advanced directive form provided today.  ?Tdap today.  ?Other orders as above. ?Follow up in 1 year pending the above workup. Starting new job in [place] in June so this may be the last time we see him.  ?The patient voiced understanding and agreement to the plan. ? ?Shelda Pal, DO ?09/22/21 ?1:32 PM ? ?

## 2021-09-22 NOTE — Patient Instructions (Signed)
Give [place] 2-3 business days to get the results of your labs back.  ? ?Keep the diet clean and stay active. ? ?Do monthly self testicular checks in the shower. You are feeling for lumps/bumps that don't belong. If you feel anything like this, let me know! ? ?Let us know if you need anything. ?

## 2022-10-08 DIAGNOSIS — H6122 Impacted cerumen, left ear: Secondary | ICD-10-CM | POA: Diagnosis not present

## 2022-10-27 DIAGNOSIS — Z9889 Other specified postprocedural states: Secondary | ICD-10-CM | POA: Diagnosis not present

## 2022-10-27 DIAGNOSIS — C419 Malignant neoplasm of bone and articular cartilage, unspecified: Secondary | ICD-10-CM | POA: Diagnosis not present

## 2022-10-27 DIAGNOSIS — Z4789 Encounter for other orthopedic aftercare: Secondary | ICD-10-CM | POA: Diagnosis not present

## 2022-10-27 DIAGNOSIS — C499 Malignant neoplasm of connective and soft tissue, unspecified: Secondary | ICD-10-CM | POA: Diagnosis not present

## 2022-10-27 DIAGNOSIS — M898X2 Other specified disorders of bone, upper arm: Secondary | ICD-10-CM | POA: Diagnosis not present

## 2022-12-16 DIAGNOSIS — K828 Other specified diseases of gallbladder: Secondary | ICD-10-CM | POA: Diagnosis not present

## 2022-12-16 DIAGNOSIS — R1011 Right upper quadrant pain: Secondary | ICD-10-CM | POA: Diagnosis not present

## 2022-12-16 DIAGNOSIS — R739 Hyperglycemia, unspecified: Secondary | ICD-10-CM | POA: Diagnosis not present

## 2022-12-16 DIAGNOSIS — R944 Abnormal results of kidney function studies: Secondary | ICD-10-CM | POA: Diagnosis not present

## 2022-12-17 DIAGNOSIS — K828 Other specified diseases of gallbladder: Secondary | ICD-10-CM | POA: Diagnosis not present

## 2023-05-11 DIAGNOSIS — J020 Streptococcal pharyngitis: Secondary | ICD-10-CM | POA: Diagnosis not present
